# Patient Record
Sex: Male | Born: 1990 | Race: Black or African American | Hispanic: No | Marital: Single | Smoking: Current every day smoker
Health system: Southern US, Community
[De-identification: ages and names within clinical notes are randomized; demographics above are authoritative.]

## PROBLEM LIST (undated history)

## (undated) ENCOUNTER — Emergency Department (HOSPITAL_BASED_OUTPATIENT_CLINIC_OR_DEPARTMENT_OTHER): Discharge status: Absent without leave | Payer: No Typology Code available for payment source

---
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
## 2010-10-07 ENCOUNTER — Emergency Department (HOSPITAL_BASED_OUTPATIENT_CLINIC_OR_DEPARTMENT_OTHER)
Admission: EM | Admit: 2010-10-07 | Discharge: 2010-10-07 | Disposition: A | Discharge status: Home / Self Care | Payer: BLUE CROSS/BLUE SHIELD | Attending: Emergency Medicine | Admitting: Emergency Medicine

## 2010-10-07 DIAGNOSIS — M549 Dorsalgia, unspecified: Secondary | ICD-10-CM | POA: Insufficient documentation

## 2010-10-07 DIAGNOSIS — J45909 Unspecified asthma, uncomplicated: Secondary | ICD-10-CM | POA: Insufficient documentation

## 2010-10-07 DIAGNOSIS — R197 Diarrhea, unspecified: Secondary | ICD-10-CM | POA: Insufficient documentation

## 2010-10-07 DIAGNOSIS — R112 Nausea with vomiting, unspecified: Secondary | ICD-10-CM | POA: Insufficient documentation

## 2010-10-07 LAB — URINALYSIS, ROUTINE W REFLEX MICROSCOPIC
Bilirubin Urine: NEGATIVE
Glucose, UA: NEGATIVE mg/dL
Protein, ur: NEGATIVE mg/dL
Specific Gravity, Urine: 1.03 (ref 1.005–1.030)

## 2010-10-31 LAB — POCT HEMOGLOBIN-HEMACUE: Hemoglobin: 13.8 g/dL (ref 13.0–17.0)

## 2010-12-20 NOTE — Op Note (Signed)
Maxwell Romero, Maxwell Romero                ACCOUNT NO.:  [id number]   MEDICAL RECORD NO.:  [id number]          PATIENT TYPE:  AMB   LOCATION:  DSC                          FACILITY:  [facility]   PHYSICIAN:  Feliberto Gottron. Turner Daniels, M.D.   DATE OF BIRTH:  [birth year]   DATE OF PROCEDURE:  02/19/2009  DATE OF DISCHARGE:                               OPERATIVE REPORT   PREOPERATIVE DIAGNOSES:  Right knee anterior cruciate ligament tear,  partial posterior cruciate ligament tear, medial meniscal tear, lateral  meniscal tear, and chondromalacia medial femoral condyle grade 3.   POSTOPERATIVE DIAGNOSES:  Right knee anterior cruciate ligament tear,  partial tibial collateral ligament tear, medial meniscal tear, lateral  meniscal tear, and chondromalacia medial femoral condyle grade 3.   PROCEDURE:  Right knee partial arthroscopic medial and lateral  meniscectomies, debridement of chondromalacia focal grade 3 of the  medial femoral condyle and then endoscopic allograft anterior cruciate  ligament reconstruction using a 10-mm wide-bone tendon-bone allograft 8  x 20 Linvatec Propel titanium fixation screw on the femoral side and 9 x  20 on the tibial side.   SURGEON:  Feliberto Gottron.  Turner Daniels, MD   FIRST ASSISTANT:  None.   ANESTHETIC:  General endotracheal.   ESTIMATED BLOOD LOSS:  Minimal.   FLUID REPLACEMENT:  1200 mL of crystalloid.   DRAINS PLACED:  None.   TOURNIQUET TIME:  None.   INDICATIONS FOR PROCEDURE:  A 20 year old young man who injured his knee  the first time playing football 3 months ago, second time I believe he  was doing some football practice for summer and reinjured his knee.  MRI  scan showed a partial PCL tear, complete ACL tear, significant complex  tearing of the medial meniscus and radial tearing partial of the lateral  meniscus, also some evidence of chondromalacia.  He has a buckling and  giving away of his knee.  He does not trust it and desires elective  arthroscopic evaluation  and treatment including ACL reconstruction,  attempted meniscal repair if possible, and possible lateral meniscal  repair or debridement as well.  Risks and benefits of surgery have been  discussed and questions answered.  He has chosen an allograft and he and  his parents are aware of the risks and benefits of an allograft versus  an autograft.   DESCRIPTION OF PROCEDURE:  The patient was identified by armband and  received a preoperative IV antibiotics at the holding area at [facility].  He was then taken to operating room #8.  Appropriate  site monitors were attached and general LMA anesthesia induced with the  patient in supine position.  Lateral post and Stulberg foot positioner  applied to the table and the right lower extremity was prepped and  draped in usual sterile fashion from the ankle to the midthigh.  Time-  out procedure was then performed and we made standard inferomedial and  inferolateral peripatellar portals.  At this point, the arthroscope was  introduced in the inferolateral portal and the outflow through the  inferomedial portal.  The patella, suprapatellar pouch, and  trochlear  were in good condition.  There was a small area of focal, grade 3  chondromalacia of the medial femoral condyle, was lightly debrided with  a 4.2 barracuda sucker shaver.  Moving in the medial compartment, the  patient did have a posterior horn medial meniscal tear involving the  inner two thirds of the rim that look like it might be amendable to  repairing using a suture technique with the Biomet suture gun.  With the  knee held in a varus position, the gun was brought in.  It was set for a  depth of 16 mm and the first anchor fired through and then the second  anchor fired through as well at 16 mm and then the knots were cinched  down.  Unfortunately, the outer rim was quite frayed and that did not  hold the suture anchors and at this point, we elected to go ahead and   perform a partial arthroscopic medial meniscectomy removing the  posterior horn of the medial meniscus out to the outer one third rim.  Moving into the notch, the ACL was torn off of its origin.  The stump  was debrided back to the tibial footprint and on the lateral side, the  patient also had some radial tearing of the lateral meniscus and this  was debrided back to the stable margin as well.  The knee was set at 45-  50 degrees on the Stulberg positioner and using the 4.5-hooded vortex  bur, a standard superolateral notchplasty was performed removing fibrous  tissue and about 1 mm of bone since it was a relatively acute injury.  At the back table, a 10-mm wide-bone tendon-bone allograft was prepared  and sized for the #10 sizing rings.  Going back to the knee in the  arthroscope, we went ahead and set the tibial pin guide at 55 degrees  made a 1.5-cm incision that was just distal to 2.5-cm medial to the  tibial tubercle through the skin and then drove a pin up through the pin  positioning guide through the posterior aspect of the ACL footprint.  This was overreamed with a 10-mm Badger reamer and with the knee held at  45-50 degrees of flexion, second pin was placed in the 10:30 position in  the femoral notch using the 7-mm over-the-top guide.  This was then  overreamed with the 10-mm Badger reamer and a superior notch was placed  in the femur with the notch cutting guide and the lateral notch in the  tibial with a notch cutting guide.  The allograft which had a 20-gauge  stainless steel wire in the tibial plug was then threaded through the  tibial tunnel with the patellar bone plug advancing into the femoral  tunnel.  The knee was then flexed to about 110 degrees and using a  supplemental inferomedial portal, a nitinol wire was placed in the notch  and the patellar bone plug was fixed in the femur with an 8 x 20  Linvatec Propel titanium screw, screw obtaining good firm fixation and a   nice squeak as the screw set.  The knee was then taken through range of  motion, isometry reach was within 1 mm and 90 degrees twist was placed  in the graft and of the knee to 45 degrees of flexion and tension placed  on the graft with a reverse Lachman's maneuver.  A 9 x 20 Linvatec  Propel titanium screw was used to fix the tibial bone plug in  the tibia.  The steel wire was removed and the bone plug trimmed.  The knee was  taken to full extension confirming no notch impingement.  Lachman's test  was now negative and the wound irrigated out with normal saline  solution.  The knee was once again flexed to 45 degrees after taking  photographic documentation of the graft and subcutaneous tissue and the  subcuticular tissue were closed with running 4-0 Monocryl suture in the  graft insertion wound only.  The other stab wounds were left open and a  dressing of Xeroform, 4 x 4 dressing, sponges, Webril, and Ace wrap, and  a cooling sleeve were then applied.  The patient was then awakened and  taken to the recovery room without difficulty.      Feliberto Gottron. Turner Daniels, M.D.  Electronically Signed     FJR/MEDQ  D:  02/19/2009  T:  02/20/2009  Job:  [id number]

## 2011-10-16 ENCOUNTER — Emergency Department (HOSPITAL_BASED_OUTPATIENT_CLINIC_OR_DEPARTMENT_OTHER)
Admission: EM | Admit: 2011-10-16 | Discharge: 2011-10-16 | Disposition: A | Discharge status: Home / Self Care | Payer: BC Managed Care – PPO | Attending: Emergency Medicine | Admitting: Emergency Medicine

## 2011-10-16 ENCOUNTER — Encounter (HOSPITAL_BASED_OUTPATIENT_CLINIC_OR_DEPARTMENT_OTHER): Payer: Self-pay | Admitting: *Deleted

## 2011-10-16 DIAGNOSIS — J029 Acute pharyngitis, unspecified: Secondary | ICD-10-CM | POA: Insufficient documentation

## 2011-10-16 DIAGNOSIS — R599 Enlarged lymph nodes, unspecified: Secondary | ICD-10-CM | POA: Insufficient documentation

## 2011-10-16 LAB — RAPID STREP SCREEN (MED CTR MEBANE ONLY): Streptococcus, Group A Screen (Direct): NEGATIVE

## 2011-10-16 MED ORDER — MAGIC MOUTHWASH: ORAL | Status: DC

## 2011-10-16 MED ORDER — AZITHROMYCIN 250 MG PO TABS: ORAL_TABLET | ORAL | Status: DC

## 2011-10-16 NOTE — ED Notes (Signed)
Patient reports sore throat for several days; cough and cold s/s.

## 2011-10-16 NOTE — Discharge Instructions (Signed)

## 2011-10-16 NOTE — ED Provider Notes (Signed)
History     CSN: [id number]  Arrival date & time 10/16/11  4098   First MD Initiated Contact with Patient 10/16/11 1005      Chief Complaint  Patient presents with  . Sore Throat    (Consider location/radiation/quality/duration/timing/severity/associated sxs/prior treatment) Patient is a 21 y.o. male presenting with pharyngitis. The history is provided by the patient. No language interpreter was used.  Sore Throat This is a new problem. The current episode started in the past 7 days. The problem occurs constantly. The problem has been unchanged. Associated symptoms include congestion, a sore throat and swollen glands. Pertinent negatives include no abdominal pain, chest pain, chills, coughing, fever, headaches, myalgias, nausea, neck pain, numbness, rash, vomiting or weakness. The symptoms are aggravated by swallowing. He has tried nothing for the symptoms. The treatment provided no relief.    History reviewed. No pertinent past medical history.  History reviewed. No pertinent past surgical history.  History reviewed. No pertinent family history.  History  Substance Use Topics  . Smoking status: Never Smoker   . Smokeless tobacco: Not on file  . Alcohol Use:       Review of Systems  Constitutional: Negative for fever and chills.  HENT: Positive for congestion and sore throat. Negative for facial swelling, trouble swallowing, neck pain and neck stiffness.   Respiratory: Negative for cough and shortness of breath.   Cardiovascular: Negative for chest pain.  Gastrointestinal: Negative for nausea, vomiting and abdominal pain.  Genitourinary: Negative for dysuria and flank pain.  Musculoskeletal: Negative for myalgias.  Skin: Negative for rash.  Neurological: Negative for dizziness, weakness, numbness and headaches.  Hematological: Positive for adenopathy. Does not bruise/bleed easily.  All other systems reviewed and are negative.    Allergies  Review of patient's  allergies indicates no known allergies.  Home Medications  No current outpatient prescriptions on file.  BP 140/65  Pulse 77  Temp(Src) 98.4 F (36.9 C) (Oral)  Resp 20  Ht 5\' 11"  (1.803 m)  Wt 155 lb (70.308 kg)  BMI 21.62 kg/m2  SpO2 100%  Physical Exam  Nursing note and vitals reviewed. Constitutional: He is oriented to person, place, and time. He appears well-developed and well-nourished. No distress.  HENT:  Head: Normocephalic and atraumatic. No trismus in the jaw.  Right Ear: Tympanic membrane and ear canal normal.  Left Ear: Tympanic membrane and ear canal normal.  Mouth/Throat: Uvula is midline and mucous membranes are normal. Normal dentition. No uvula swelling. Posterior oropharyngeal edema and posterior oropharyngeal erythema present. No oropharyngeal exudate or tonsillar abscesses.  Neck: Normal range of motion. Neck supple.  Cardiovascular: Normal rate, regular rhythm and normal heart sounds.   Pulmonary/Chest: Effort normal and breath sounds normal. No respiratory distress.  Abdominal: Soft. He exhibits no distension. There is no tenderness.  Musculoskeletal: Normal range of motion.  Lymphadenopathy:    He has cervical adenopathy.       Right cervical: Superficial cervical adenopathy present.       Left cervical: Superficial cervical adenopathy present.  Neurological: He is alert and oriented to person, place, and time. He exhibits normal muscle tone. Coordination normal.  Skin: Skin is warm and dry.    ED Course  Procedures (including critical care time)    Results for orders placed during the hospital encounter of 10/16/11  RAPID STREP SCREEN      Component Value Range   Streptococcus, Group A Screen (Direct) NEGATIVE  NEGATIVE        MDM  Patient is alert, nontoxic appearing, oropharynx is erythematous and edematous without exudates. No trismus. Mild anterior cervical lymphadenopathy. No meningeal signs, no peritonsillar abscess.  I will treat  with Magic mouthwash and antibiotics he agrees to close followup with his primary care physician or to return here symptoms worsen.   Patient / Family / Caregiver understand and agree with initial ED impression and plan with expectations set for ED visit. Pt stable in ED with no significant deterioration in condition. Pt feels improved after observation and/or treatment in ED.       Kearston Putman L. [place], [place] 10/16/11 1033

## 2011-10-16 NOTE — ED Provider Notes (Signed)
Medical screening examination/treatment/procedure(s) were performed by non-physician practitioner and as supervising physician I was immediately available for consultation/collaboration.  Shelda Jakes, MD 10/16/11 2017

## 2011-10-16 NOTE — ED Notes (Signed)
Pt amb to room 2 with quick steady gait in nad. Pt reports sore throat x 3 days, denies fevers or congestion. Denies any n/v/d or other c/o.

## 2012-06-12 ENCOUNTER — Emergency Department (HOSPITAL_BASED_OUTPATIENT_CLINIC_OR_DEPARTMENT_OTHER): Payer: BC Managed Care – PPO

## 2012-06-12 ENCOUNTER — Emergency Department (HOSPITAL_BASED_OUTPATIENT_CLINIC_OR_DEPARTMENT_OTHER)
Admission: EM | Admit: 2012-06-12 | Discharge: 2012-06-12 | Disposition: A | Discharge status: Home / Self Care | Payer: BC Managed Care – PPO | Attending: Emergency Medicine | Admitting: Emergency Medicine

## 2012-06-12 ENCOUNTER — Encounter (HOSPITAL_BASED_OUTPATIENT_CLINIC_OR_DEPARTMENT_OTHER): Payer: Self-pay | Admitting: Family Medicine

## 2012-06-12 DIAGNOSIS — R509 Fever, unspecified: Secondary | ICD-10-CM | POA: Insufficient documentation

## 2012-06-12 DIAGNOSIS — R55 Syncope and collapse: Secondary | ICD-10-CM | POA: Insufficient documentation

## 2012-06-12 DIAGNOSIS — J45909 Unspecified asthma, uncomplicated: Secondary | ICD-10-CM | POA: Insufficient documentation

## 2012-06-12 DIAGNOSIS — F17200 Nicotine dependence, unspecified, uncomplicated: Secondary | ICD-10-CM | POA: Insufficient documentation

## 2012-06-12 LAB — COMPREHENSIVE METABOLIC PANEL
ALT: 15 U/L (ref 0–53)
Albumin: 4.2 g/dL (ref 3.5–5.2)
CO2: 27 mEq/L (ref 19–32)
Calcium: 9.7 mg/dL (ref 8.4–10.5)
Chloride: 100 mEq/L (ref 96–112)
Creatinine, Ser: 1.2 mg/dL (ref 0.50–1.35)
GFR calc non Af Amer: 86 mL/min — ABNORMAL LOW (ref >90)
Sodium: 138 mEq/L (ref 135–145)
Total Protein: 7.5 g/dL (ref 6.0–8.3)

## 2012-06-12 LAB — CBC WITH DIFFERENTIAL/PLATELET
Hemoglobin: 13.5 g/dL (ref 13.0–17.0)
Lymphocytes Relative: 12 % (ref 12–46)
Lymphs Abs: 1.2 10*3/uL (ref 0.7–4.0)
MCH: 23.3 pg — ABNORMAL LOW (ref 26.0–34.0)
MCV: 68.4 fL — ABNORMAL LOW (ref 78.0–100.0)
Monocytes Relative: 11 % (ref 3–12)
Platelets: 207 10*3/uL (ref 150–400)
RDW: 15.6 % — ABNORMAL HIGH (ref 11.5–15.5)
WBC: 9.6 10*3/uL (ref 4.0–10.5)

## 2012-06-12 MED ORDER — IPRATROPIUM BROMIDE 0.02 % IN SOLN
0.5000 mg | Freq: Once | RESPIRATORY_TRACT | Status: AC
  Administered 2012-06-12: 0.5 mg via RESPIRATORY_TRACT
  Filled 2012-06-12: qty 2.5

## 2012-06-12 MED ORDER — SODIUM CHLORIDE 0.9 % IV BOLUS (SEPSIS)
1000.0000 mL | Freq: Once | INTRAVENOUS | Status: AC
  Administered 2012-06-12: 1000 mL via INTRAVENOUS

## 2012-06-12 MED ORDER — AZITHROMYCIN 250 MG PO TABS: ORAL_TABLET | ORAL | Status: DC

## 2012-06-12 MED ORDER — ALBUTEROL SULFATE (5 MG/ML) 0.5% IN NEBU
5.0000 mg | INHALATION_SOLUTION | Freq: Once | RESPIRATORY_TRACT | Status: AC
  Administered 2012-06-12: 5 mg via RESPIRATORY_TRACT
  Filled 2012-06-12: qty 1

## 2012-06-12 MED ORDER — ALBUTEROL SULFATE HFA 108 (90 BASE) MCG/ACT IN AERS
2.0000 | INHALATION_SPRAY | Freq: Once | RESPIRATORY_TRACT | Status: AC
  Administered 2012-06-12: 2 via RESPIRATORY_TRACT
  Filled 2012-06-12: qty 6.7

## 2012-06-12 MED ORDER — PREDNISONE 10 MG PO TABS: 20.0000 mg | ORAL_TABLET | Freq: Every day | ORAL | Status: DC

## 2012-06-12 MED ORDER — PREDNISONE 50 MG PO TABS
60.0000 mg | ORAL_TABLET | Freq: Once | ORAL | Status: AC
  Administered 2012-06-12: 60 mg via ORAL
  Filled 2012-06-12: qty 1

## 2012-06-12 NOTE — ED Provider Notes (Addendum)
History     CSN: [id number]  Arrival date & time 06/12/12  1308   First MD Initiated Contact with Patient 06/12/12 1055      Chief Complaint  Patient presents with  . Loss of Consciousness    (Consider location/radiation/quality/duration/timing/severity/associated sxs/prior treatment) HPI  Patient with uri symptoms began Saturday with decreased appetite, cough, and taking cold meds.  Today took cold medicine and went to work this am and felt hot and sweaty , then lightheaded, seeing dots, knees week and and passed out and struck face.  Patient awake right away.  Work called mom and she brought to ed.  No chest pain.  Decreased po for several days.  Patient with history of asthma but hasn't requierd meds for several years.    History reviewed. No pertinent past medical history.  History reviewed. No pertinent past surgical history.  No family history on file.  History  Substance Use Topics  . Smoking status: Current Every Day Smoker    Types: Cigars  . Smokeless tobacco: Not on file  . Alcohol Use: No      Review of Systems  Constitutional: Positive for fever, chills and appetite change.  HENT: Negative for neck stiffness.   Eyes: Negative for visual disturbance.  Respiratory: Positive for cough. Negative for shortness of breath.   Cardiovascular: Negative for chest pain.  Gastrointestinal: Negative for vomiting, diarrhea and blood in stool.  Genitourinary: Negative for dysuria, frequency and decreased urine volume.  Musculoskeletal: Negative for myalgias and joint swelling.  Skin: Negative for rash.  Neurological: Negative for weakness.  Hematological: Negative for adenopathy.  Psychiatric/Behavioral: Negative for agitation.    Allergies  Review of patient's allergies indicates no known allergies.  Home Medications   Current Outpatient Rx  Name  Route  Sig  Dispense  Refill  . MAGIC MOUTHWASH      5 ml po swish and spit 3 times a day prn   60 mL   0   .  AZITHROMYCIN 250 MG PO TABS      Take two tablets on day one, then one tab qd days 2-5   6 tablet   0     BP 128/70  Pulse 80  Temp 98.7 F (37.1 C) (Oral)  Resp 18  Ht 6' (1.829 m)  Wt 155 lb (70.308 kg)  BMI 21.02 kg/m2  SpO2 96%  Physical Exam  Nursing note and vitals reviewed. Constitutional: He is oriented to person, place, and time. He appears well-developed and well-nourished.  HENT:  Head: Normocephalic and atraumatic.  Right Ear: External ear normal.  Left Ear: External ear normal.  Nose: Nose normal.  Mouth/Throat: Oropharynx is clear and moist.       Right periorbital contusion  Eyes: Conjunctivae normal and EOM are normal. Pupils are equal, round, and reactive to light.  Neck: Normal range of motion. Neck supple.  Cardiovascular: Normal rate, regular rhythm, normal heart sounds and intact distal pulses.   Pulmonary/Chest: Effort normal. No respiratory distress. He has wheezes. He exhibits no tenderness.       Diffuse inspiratory and expiratory wheezes and rhonchi.  Abdominal: Soft. Bowel sounds are normal. He exhibits no distension and no mass. There is no tenderness. There is no guarding.  Musculoskeletal: Normal range of motion.  Neurological: He is alert and oriented to person, place, and time. He has normal reflexes. He exhibits normal muscle tone. Coordination normal.  Skin: Skin is warm and dry.  Psychiatric: He has a normal  mood and affect. His behavior is normal. Judgment and thought content normal.    ED Course  Procedures (including critical care time)  Labs Reviewed  CBC WITH DIFFERENTIAL - Abnormal; Notable for the following:    MCV 68.4 (*)     MCH 23.3 (*)     RDW 15.6 (*)     All other components within normal limits  GLUCOSE, CAPILLARY  COMPREHENSIVE METABOLIC PANEL   No results found.   No diagnosis found.   Date: 06/12/2012  Rate: 85  Rhythm: normal sinus rhythm  QRS Axis: normal  Intervals: normal  ST/T Wave abnormalities:  normal  Conduction Disutrbances: none  Narrative Interpretation: unremarkable      MDM   Patient given two liters ns and albuterol and prednisone.  Patient continues with inspiratory and expiratory wheezes improved after albuterol..  Patient advised to stop smoking.  Plan albuterol, prednisone, zithromax.        Hilario Quarry, MD 06/12/12 1329  Hilario Quarry, MD 06/12/12 [id number]
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
## 2012-06-12 NOTE — ED Notes (Signed)
CBG 86. 

## 2012-06-12 NOTE — ED Notes (Signed)
Patient transported to X-ray via stretcher 

## 2013-12-01 IMAGING — CR DG CHEST 2V
2 series · 2 of 2 positions shown · non-contrast
Comparison: None

CLINICAL DATA: Wheezing.  Cough and syncope

CHEST - 2 VIEW

[w chest pa]
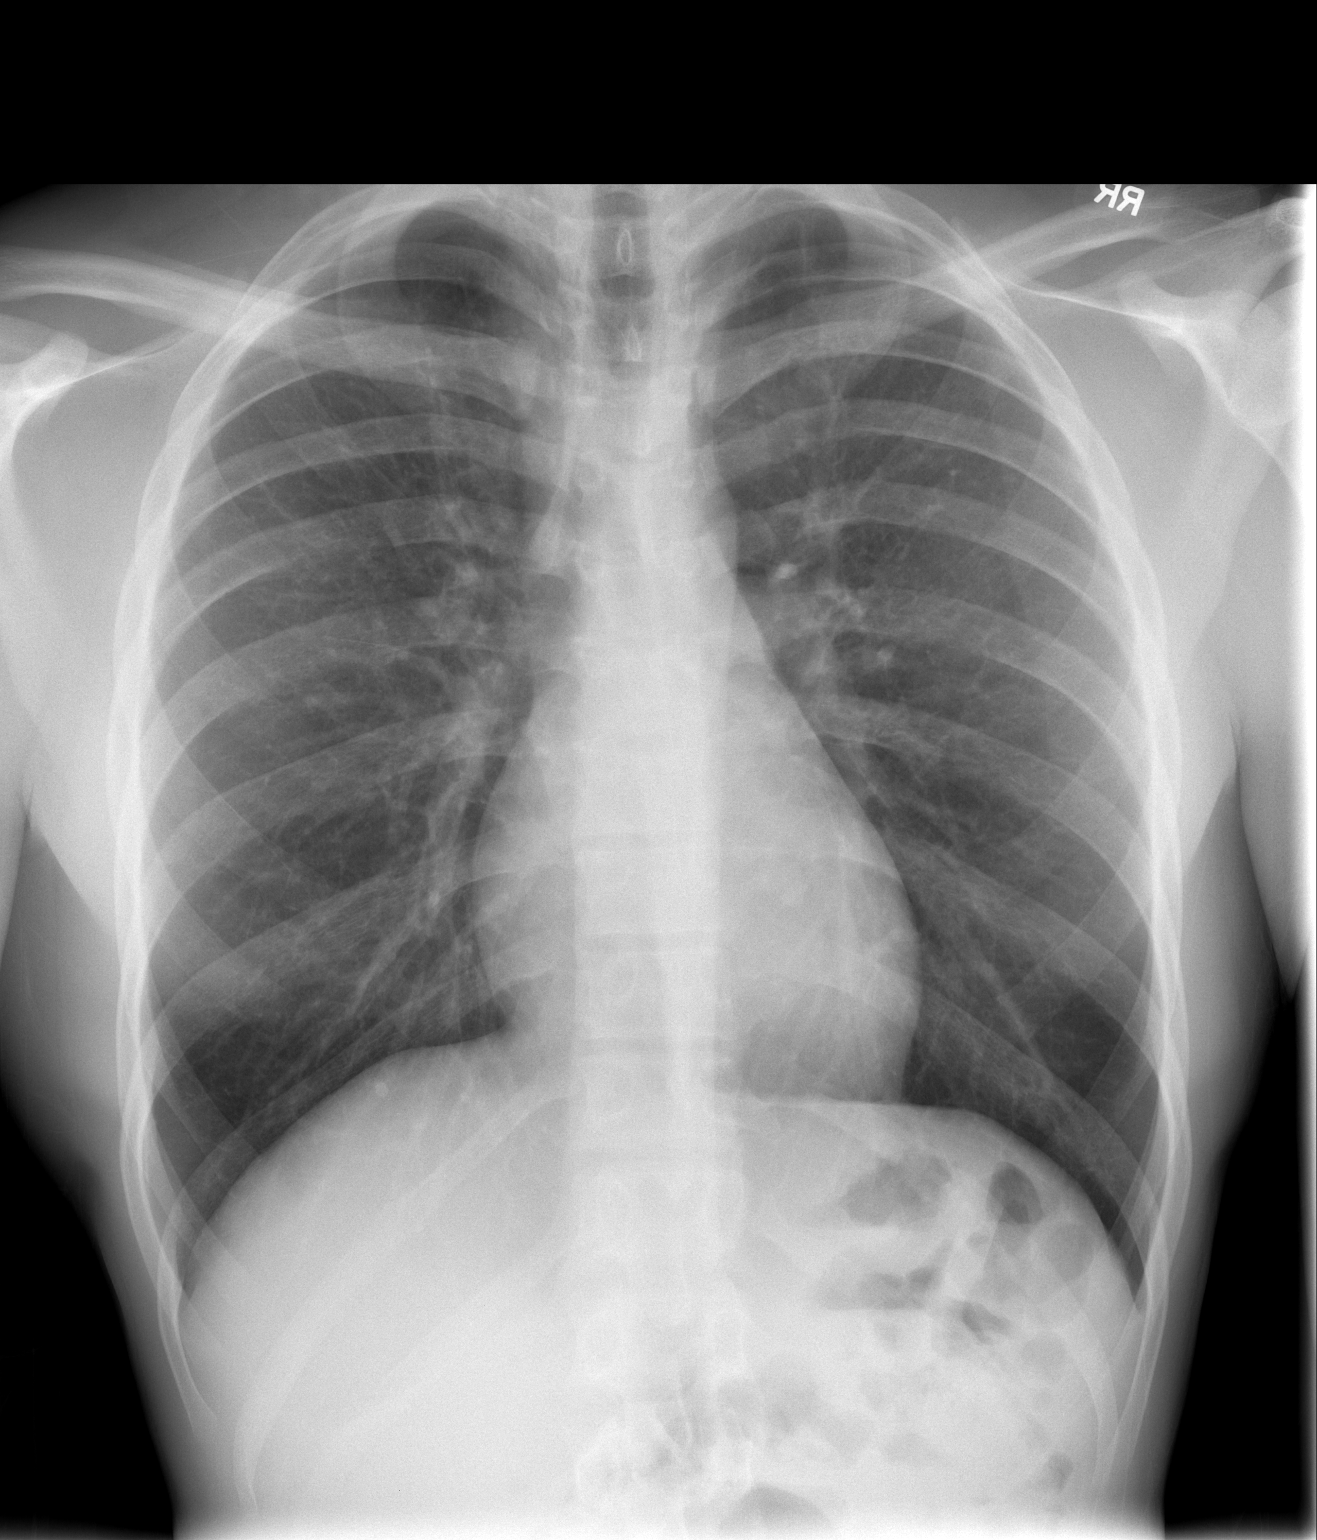

[w chest lat]
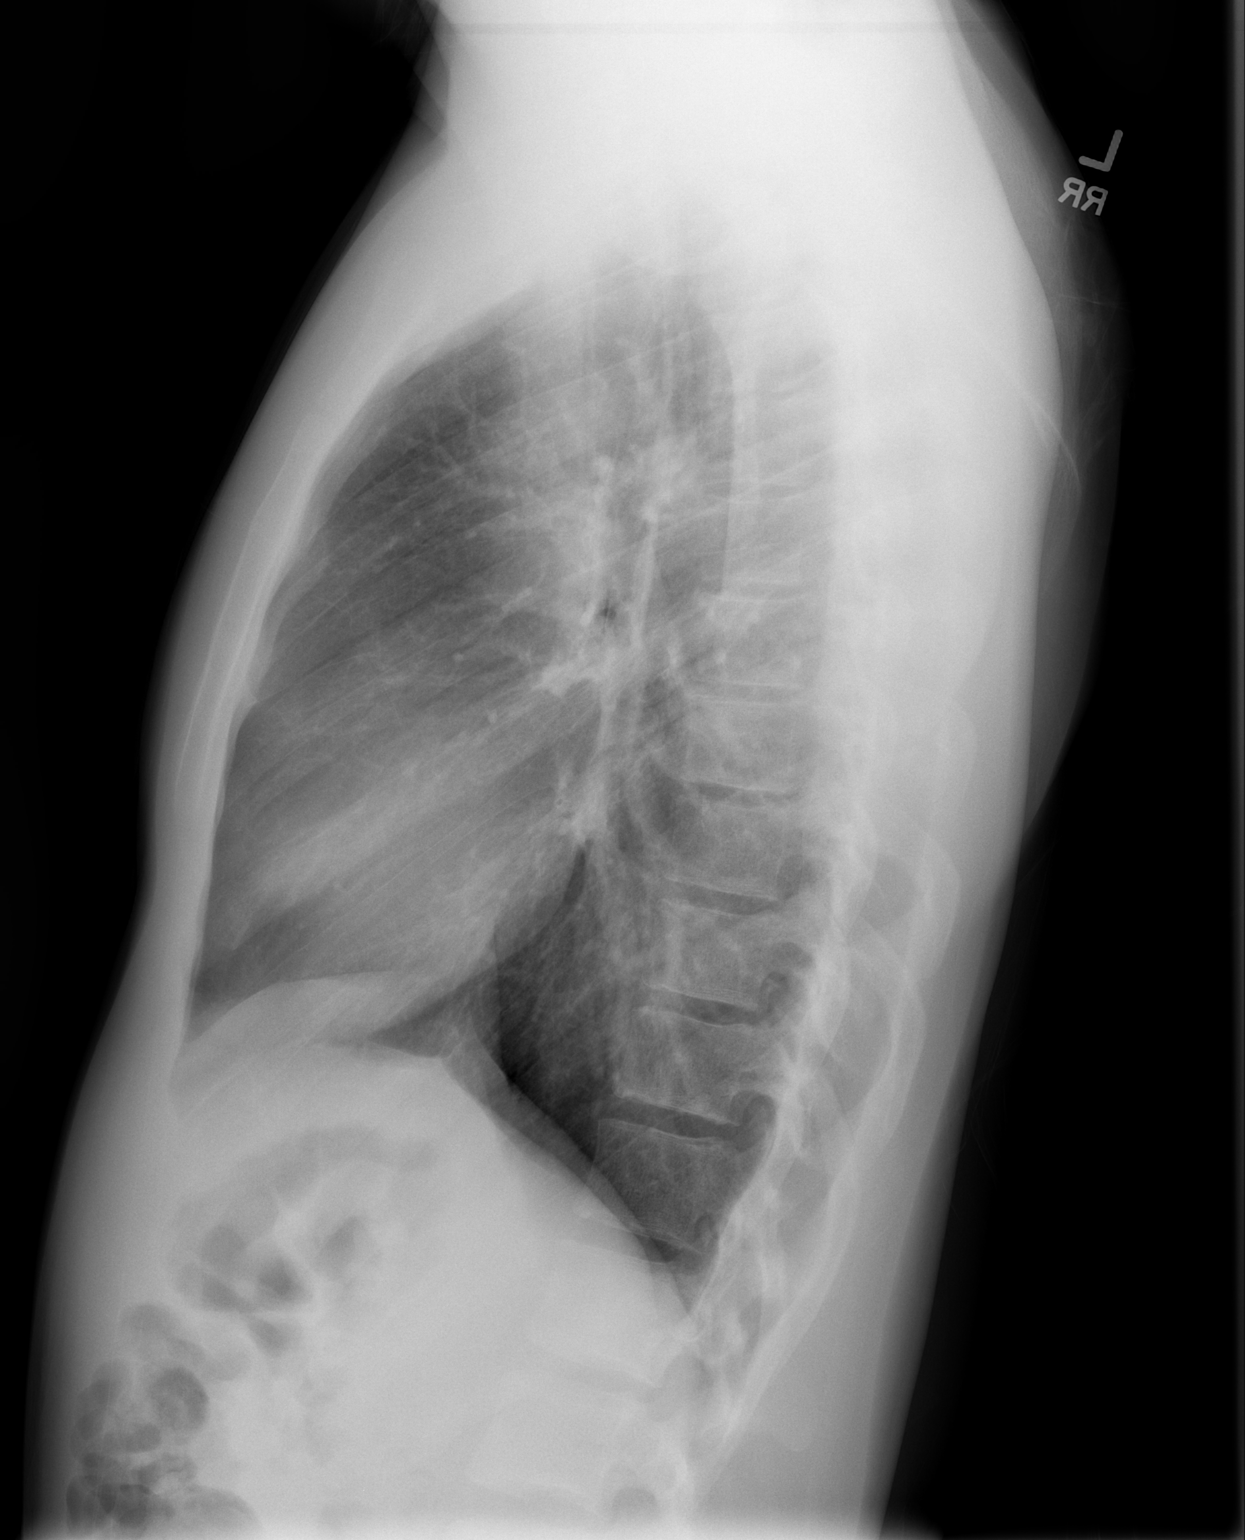

[2 of 2 positions shown; findings below may reference images not displayed]

FINDINGS: The heart size and mediastinal contours are within normal
limits.  Both lungs are clear.  The visualized skeletal structures
are unremarkable.
IMPRESSION: No acute cardiopulmonary abnormalities.

## 2018-12-28 ENCOUNTER — Emergency Department (HOSPITAL_BASED_OUTPATIENT_CLINIC_OR_DEPARTMENT_OTHER): Payer: Self-pay

## 2018-12-28 ENCOUNTER — Encounter (HOSPITAL_BASED_OUTPATIENT_CLINIC_OR_DEPARTMENT_OTHER): Payer: Self-pay | Admitting: *Deleted

## 2018-12-28 ENCOUNTER — Other Ambulatory Visit: Payer: Self-pay

## 2018-12-28 ENCOUNTER — Emergency Department (HOSPITAL_BASED_OUTPATIENT_CLINIC_OR_DEPARTMENT_OTHER)
Admission: EM | Admit: 2018-12-28 | Discharge: 2018-12-28 | Disposition: A | Discharge status: Home / Self Care | Payer: Self-pay | Attending: Emergency Medicine | Admitting: Emergency Medicine

## 2018-12-28 DIAGNOSIS — Y999 Unspecified external cause status: Secondary | ICD-10-CM | POA: Insufficient documentation

## 2018-12-28 DIAGNOSIS — Y9389 Activity, other specified: Secondary | ICD-10-CM | POA: Insufficient documentation

## 2018-12-28 DIAGNOSIS — Y92410 Unspecified street and highway as the place of occurrence of the external cause: Secondary | ICD-10-CM | POA: Insufficient documentation

## 2018-12-28 DIAGNOSIS — F17290 Nicotine dependence, other tobacco product, uncomplicated: Secondary | ICD-10-CM | POA: Insufficient documentation

## 2018-12-28 DIAGNOSIS — S86911A Strain of unspecified muscle(s) and tendon(s) at lower leg level, right leg, initial encounter: Secondary | ICD-10-CM | POA: Insufficient documentation

## 2018-12-28 DIAGNOSIS — Z79899 Other long term (current) drug therapy: Secondary | ICD-10-CM | POA: Insufficient documentation

## 2018-12-28 MED ORDER — IBUPROFEN 800 MG PO TABS
800.0000 mg | ORAL_TABLET | Freq: Once | ORAL | Status: AC
  Administered 2018-12-28: 12:00:00 800 mg via ORAL
  Filled 2018-12-28: qty 1
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
## 2018-12-30 ENCOUNTER — Encounter (HOSPITAL_BASED_OUTPATIENT_CLINIC_OR_DEPARTMENT_OTHER): Payer: Self-pay

## 2018-12-30 ENCOUNTER — Emergency Department (HOSPITAL_BASED_OUTPATIENT_CLINIC_OR_DEPARTMENT_OTHER)
Admission: EM | Admit: 2018-12-30 | Discharge: 2018-12-30 | Disposition: A | Discharge status: Home / Self Care | Payer: Self-pay | Attending: Emergency Medicine | Admitting: Emergency Medicine

## 2018-12-30 ENCOUNTER — Other Ambulatory Visit: Payer: Self-pay

## 2018-12-30 DIAGNOSIS — F17290 Nicotine dependence, other tobacco product, uncomplicated: Secondary | ICD-10-CM | POA: Insufficient documentation

## 2018-12-30 DIAGNOSIS — K0889 Other specified disorders of teeth and supporting structures: Secondary | ICD-10-CM | POA: Insufficient documentation

## 2018-12-30 DIAGNOSIS — Z79899 Other long term (current) drug therapy: Secondary | ICD-10-CM | POA: Insufficient documentation

## 2018-12-30 DIAGNOSIS — K029 Dental caries, unspecified: Secondary | ICD-10-CM | POA: Insufficient documentation

## 2018-12-30 MED ORDER — PENICILLIN V POTASSIUM 500 MG PO TABS: 500.0000 mg | ORAL_TABLET | Freq: Four times a day (QID) | ORAL | 0 refills | Status: AC

## 2018-12-30 MED ORDER — NAPROXEN 500 MG PO TABS: 500.0000 mg | ORAL_TABLET | Freq: Two times a day (BID) | ORAL | 0 refills | Status: AC
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
## 2018-12-30 NOTE — ED Provider Notes (Signed)
[facility] EMERGENCY DEPARTMENT Provider Note   CSN: [id number] Arrival date & time: 12/30/18  1654    History   Chief Complaint Chief Complaint  Patient presents with  . Dental Pain    HPI Maxwell Romero is a 28 y.o. male with a history of tobacco abuse who presents to the emergency department with complaints of right lower dental pain for the past 4 days.  Patient states he has had several issues with his right second lower molar, has required fillings, and most recently a capping.  He states that the cap came off with onset of pain. Pain is constant. 9/10 in severity. No alleviating/aggravating factors. Trying motrin without relief. Plans to call his dentist tomorrow AM. Denies fever, chills, swelling beneath the tongue, dysphagia, dyspnea, or vomiting.      HPI  History reviewed. No pertinent past medical history.  There are no active problems to display for this patient.   Past Surgical History:  Procedure Laterality Date  . Right Knee surgery x 3          Home Medications    Prior to Admission medications   Medication Sig Start Date End Date Taking? Authorizing Provider  Alum & Mag Hydroxide-Simeth (MAGIC MOUTHWASH) SOLN 5 ml po swish and spit 3 times a day prn 10/16/11   Triplett, Tammy, PA-C  azithromycin (ZITHROMAX Z-PAK) 250 MG tablet Take two tablets on day one, then one tab qd days 2-5 10/16/11   Triplett, Tammy, PA-C  azithromycin (ZITHROMAX Z-PAK) 250 MG tablet Per package instructions 06/12/12   Margarita Grizzle, MD  predniSONE (DELTASONE) 10 MG tablet Take 2 tablets (20 mg total) by mouth daily. 06/12/12   Margarita Grizzle, MD    Family History History reviewed. No pertinent family history.  Social History Social History   Tobacco Use  . Smoking status: Current Every Day Smoker    Types: Cigars  . Smokeless tobacco: Never Used  Substance Use Topics  . Alcohol use: Yes    Comment: occ  . Drug use: No     Allergies   Patient has no known  allergies.   Review of Systems Review of Systems  Constitutional: Negative for chills and fever.  HENT: Positive for dental problem. Negative for ear pain and trouble swallowing.   Respiratory: Negative for shortness of breath.   Cardiovascular: Negative for chest pain.  Gastrointestinal: Negative for vomiting.     Physical Exam Updated Vital Signs BP 124/82 (BP Location: Right Arm)   Pulse 64   Temp 98.7 F (37.1 C) (Oral)   Resp 18   Ht 6' (1.829 m)   Wt 66.7 kg   SpO2 99%   BMI 19.94 kg/m   Physical Exam Vitals signs and nursing note reviewed.  Constitutional:      General: He is not in acute distress.    Appearance: He is well-developed. He is not toxic-appearing.  HENT:     Head: Normocephalic and atraumatic.     Right Ear: Tympanic membrane is not perforated, erythematous, retracted or bulging.     Left Ear: Tympanic membrane is not perforated, erythematous, retracted or bulging.     Nose: Nose normal.     Mouth/Throat:     Pharynx: Uvula midline. No oropharyngeal exudate or posterior oropharyngeal erythema.      Comments: Posterior oropharynx is symmetric appearing. Patient tolerating own secretions without difficulty. No trismus. No drooling. No hot potato voice. No swelling beneath the tongue, submandibular compartment is soft.  Eyes:  General:        Right eye: No discharge.        Left eye: No discharge.     Conjunctiva/sclera: Conjunctivae normal.  Neck:     Musculoskeletal: Normal range of motion and neck supple. No neck rigidity.  Lymphadenopathy:     Cervical: No cervical adenopathy.  Neurological:     Mental Status: He is alert.  Psychiatric:        Behavior: Behavior normal.        Thought Content: Thought content normal.    ED Treatments / Results  Labs (all labs ordered are listed, but only abnormal results are displayed) Labs Reviewed - No data to display  EKG None  Radiology No results found.  Procedures Procedures (including  critical care time)  Medications Ordered in ED Medications - No data to display   Initial Impression / Assessment and Plan / ED Course  I have reviewed the triage vital signs and the nursing notes.  Pertinent labs & imaging results that were available during my care of the patient were reviewed by me and considered in my medical decision making (see chart for details).    Patient presents with dental pain. Patient is nontoxic appearing, vitals without significant abnormality. No gross abscess.  Exam unconcerning for Ludwig's angina or spread of infection.  Will treat with Penicillin VK and Naproxen.  Urged patient to follow-up with dentist, dental resources were provided.  Discussed treatment plan and need for follow up as well as return precautions. Provided opportunity for questions, patient confirmed understanding and is agreeable to plan.   Final Clinical Impressions(s) / ED Diagnoses   Final diagnoses:  Pain, dental    ED Discharge Orders         Ordered    penicillin v potassium (VEETID) 500 MG tablet  4 times daily     12/30/18 1722    naproxen (NAPROSYN) 500 MG tablet  2 times daily     12/30/18 [address], PA-C 12/30/18 1723    Maia Plan, MD 12/30/18 [phone]

## 2018-12-30 NOTE — Discharge Instructions (Signed)
Call one of the dentists offices provided to schedule an appointment for re-evaluation and further management within the next 48 hours.  ° °I have prescribed you Penicillin which is an antibiotic to treat the infection and Naproxen which is an anti-inflammatory medicine to treat the pain.  ° °Please take all of your antibiotics until finished. You may develop abdominal discomfort or diarrhea from the antibiotic.  You may help offset this with probiotics which you can buy at the store (ask your pharmacist if unable to find) or get probiotics in the form of eating yogurt. Do not eat or take the probiotics until 2 hours after your antibiotic. If you are unable to tolerate these side effects follow-up with your primary care provider or return to the emergency department.  ° °If you begin to experience any blistering, rashes, swelling, or difficulty breathing seek medical care for evaluation of potentially more serious side effects.  ° °Be sure to eat something when taking the Naproxen as it can cause stomach upset and at worst stomach bleeding. Do not take additional non steroidal anti-inflammatory medicines such as Ibuprofen, Aleve, Advil, Mobic, Diclofenac, or goodie powder while taking Naproxen. You may supplement with Tylenol.  ° °We have prescribed you new medication(s) today. Discuss the medications prescribed today with your pharmacist as they can have adverse effects and interactions with your other medicines including over the counter and prescribed medications. Seek medical evaluation if you start to experience new or abnormal symptoms after taking one of these medicines, seek care immediately if you start to experience difficulty breathing, feeling of your throat closing, facial swelling, or rash as these could be indications of a more serious allergic reaction ° °If you start to experience and new or worsening symptoms return to the emergency department. If you start to experience fever, chills, neck  stiffness/pain, or inability to move your neck or open your mouth come back to the emergency department immediately.  ° °
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
